# Patient Record
Sex: Female | Born: 1955 | Race: White | Hispanic: No | Marital: Married | Smoking: Never smoker
Health system: Southern US, Community
[De-identification: ages and names within clinical notes are randomized; demographics above are authoritative.]

## PROBLEM LIST (undated history)

## (undated) DIAGNOSIS — F32A Depression, unspecified: Secondary | ICD-10-CM

## (undated) DIAGNOSIS — Z780 Asymptomatic menopausal state: Secondary | ICD-10-CM

## (undated) DIAGNOSIS — F329 Major depressive disorder, single episode, unspecified: Secondary | ICD-10-CM

## (undated) HISTORY — DX: Major depressive disorder, single episode, unspecified: F32.9

## (undated) HISTORY — DX: Depression, unspecified: F32.A

## (undated) HISTORY — DX: Asymptomatic menopausal state: Z78.0

---

## 1999-05-07 ENCOUNTER — Ambulatory Visit (HOSPITAL_COMMUNITY): Admission: RE | Admit: 1999-05-07 | Discharge: 1999-05-07 | Payer: Self-pay | Admitting: Family Medicine

## 1999-05-07 ENCOUNTER — Encounter: Payer: Self-pay | Admitting: Family Medicine

## 1999-07-09 ENCOUNTER — Other Ambulatory Visit: Admission: RE | Admit: 1999-07-09 | Discharge: 1999-07-09 | Payer: Self-pay | Admitting: Family Medicine

## 2000-09-25 ENCOUNTER — Other Ambulatory Visit: Admission: RE | Admit: 2000-09-25 | Discharge: 2000-09-25 | Payer: Self-pay | Admitting: Family Medicine

## 2000-10-06 ENCOUNTER — Ambulatory Visit (HOSPITAL_COMMUNITY): Admission: RE | Admit: 2000-10-06 | Discharge: 2000-10-06 | Payer: Self-pay | Admitting: Family Medicine

## 2000-10-06 ENCOUNTER — Encounter: Payer: Self-pay | Admitting: Family Medicine

## 2001-08-16 ENCOUNTER — Ambulatory Visit (HOSPITAL_COMMUNITY): Admission: RE | Admit: 2001-08-16 | Discharge: 2001-08-16 | Payer: Self-pay | Admitting: Gastroenterology

## 2003-12-21 ENCOUNTER — Ambulatory Visit (HOSPITAL_COMMUNITY): Admission: RE | Admit: 2003-12-21 | Discharge: 2003-12-21 | Payer: Self-pay | Admitting: Family Medicine

## 2005-05-07 ENCOUNTER — Ambulatory Visit (HOSPITAL_COMMUNITY): Admission: RE | Admit: 2005-05-07 | Discharge: 2005-05-07 | Payer: Self-pay | Admitting: Family Medicine

## 2005-06-11 ENCOUNTER — Other Ambulatory Visit: Admission: RE | Admit: 2005-06-11 | Discharge: 2005-06-11 | Payer: Self-pay | Admitting: Family Medicine

## 2006-01-23 ENCOUNTER — Encounter: Admission: RE | Admit: 2006-01-23 | Discharge: 2006-01-23 | Payer: Self-pay | Admitting: Family Medicine

## 2006-06-26 ENCOUNTER — Other Ambulatory Visit: Admission: RE | Admit: 2006-06-26 | Discharge: 2006-06-26 | Payer: Self-pay | Admitting: Obstetrics and Gynecology

## 2007-07-01 ENCOUNTER — Ambulatory Visit (HOSPITAL_COMMUNITY): Admission: RE | Admit: 2007-07-01 | Discharge: 2007-07-01 | Payer: Self-pay | Admitting: Family Medicine

## 2007-08-27 ENCOUNTER — Other Ambulatory Visit: Admission: RE | Admit: 2007-08-27 | Discharge: 2007-08-27 | Payer: Self-pay | Admitting: Obstetrics and Gynecology

## 2008-08-02 ENCOUNTER — Ambulatory Visit (HOSPITAL_COMMUNITY): Admission: RE | Admit: 2008-08-02 | Discharge: 2008-08-02 | Payer: Self-pay | Admitting: Family Medicine

## 2008-12-18 HISTORY — PX: COLONOSCOPY: SHX174

## 2010-07-05 NOTE — Procedures (Signed)
[place]  Patient:    Andrea Estrada, Andrea Estrada Visit Number: [id number] MRN: [id number]          Service Type: END Location: ENDO Attending Physician:  Nelda Marseille Dictated by:   Petra Kuba, M.D. Proc. Date: 08/16/01 Admit Date:  08/16/2001   CC:         Gretta Arab. Valentina Lucks, M.D.   Procedure Report  PROCEDURE:  Colonoscopy.  INDICATIONS FOR PROCEDURE:  Family history of colon cancer with an aunt at a young age.  CONSENT:  Consent was signed.  MEDICATIONS USED: 1. Demerol 50 mg. 2. Versed 5 mg.  DESCRIPTION OF PROCEDURE:  Rectal inspection was pertinent for external hemorrhoids, small.  Digital examination was negative.  Pediatric video adjustable colonoscope was inserted, easily advanced around the colon to the cecum.  This did require some abdominal pressure, but no position changes. Cecum was identified by the appendiceal orifice and ileocecal valve.  The scope was inserted a short ways in the terminal ileum which was normal. Photodocumentation was obtained.  The scope was slowly withdrawn.  On slow withdrawal through the colon, the prep was adequate.  There was some liquid stool that required washing and suctioning, but no abnormalities were seen as we slowly withdrew back to the rectum.  Once back in the rectum, scope was retroflexed, pertinent for some tiny internal hemorrhoids.  Scope was straightened and readvanced a short ways up the left side of the colon, air was suctioned, scope removed.  The patient tolerated the procedure well. There was no obvious immediate complication.  ENDOSCOPIC DIAGNOSES: 1. Internal and external hemorrhoids, tiny. 2. Otherwise within normal limits to the terminal ileum.  PLAN:  Happy to see back p.r.n.  Repeat screening in 5 years.  Otherwise, return care to Dr. Valentina Lucks for the customary health care maintenance to include yearly rectals and guaiacs. Dictated by:   Petra Kuba,  M.D. Attending Physician:  Nelda Marseille DD:  08/16/01 TD:  08/17/01 Job: [id number]

## 2011-06-16 LAB — HM PAP SMEAR: HM Pap smear: NEGATIVE

## 2011-07-28 ENCOUNTER — Other Ambulatory Visit (HOSPITAL_COMMUNITY): Payer: Self-pay | Admitting: Family Medicine

## 2011-07-28 DIAGNOSIS — Z1231 Encounter for screening mammogram for malignant neoplasm of breast: Secondary | ICD-10-CM

## 2011-07-31 ENCOUNTER — Ambulatory Visit (HOSPITAL_COMMUNITY)
Admission: RE | Admit: 2011-07-31 | Discharge: 2011-07-31 | Disposition: A | Discharge status: Home / Self Care | Payer: BC Managed Care – PPO | Source: Ambulatory Visit | Attending: Family Medicine | Admitting: Family Medicine

## 2011-07-31 DIAGNOSIS — Z1231 Encounter for screening mammogram for malignant neoplasm of breast: Secondary | ICD-10-CM | POA: Insufficient documentation

## 2011-07-31 LAB — HM MAMMOGRAPHY: HM Mammogram: NEGATIVE

## 2012-06-15 ENCOUNTER — Encounter: Payer: Self-pay | Admitting: *Deleted

## 2012-06-17 ENCOUNTER — Ambulatory Visit: Payer: Self-pay | Admitting: Nurse Practitioner

## 2012-06-22 ENCOUNTER — Telehealth: Payer: Self-pay | Admitting: *Deleted

## 2012-06-22 NOTE — Telephone Encounter (Signed)
OK to refill Wellbutrin for 3 months and in the interim can come in for AEX.

## 2012-06-22 NOTE — Telephone Encounter (Signed)
Faxed request for Wellbutrin HCL XL 150 last seen 06/16/11 not scheduled for anything this year. Let me know how to proceed.

## 2012-06-23 ENCOUNTER — Other Ambulatory Visit: Payer: Self-pay | Admitting: *Deleted

## 2012-06-23 MED ORDER — BUPROPION HCL ER (XL) 150 MG PO TB24: 150.0000 mg | ORAL_TABLET | Freq: Every day | ORAL | Status: DC

## 2012-06-23 MED ORDER — BUPROPION HCL ER (SR) 150 MG PO TB12: 150.0000 mg | ORAL_TABLET | Freq: Every day | ORAL | Status: DC

## 2012-06-23 NOTE — Addendum Note (Signed)
Addended by: Luisa Dago on: 06/23/2012 04:24 PM   Modules accepted: Orders

## 2012-06-23 NOTE — Telephone Encounter (Signed)
Ok'd by [facility]

## 2012-06-23 NOTE — Telephone Encounter (Signed)
Pharmacy/pt requesting 90 day supply.  Pt previously approved 3 month supply.  RX re-sent as 90 day.  Also changed from SR to XL as XL is listed on fax refill request.

## 2013-02-23 ENCOUNTER — Other Ambulatory Visit: Payer: Self-pay | Admitting: Nurse Practitioner

## 2013-02-23 DIAGNOSIS — Z1231 Encounter for screening mammogram for malignant neoplasm of breast: Secondary | ICD-10-CM

## 2013-02-23 NOTE — Telephone Encounter (Signed)
Last Refilled: 06/16/11 #90/3 refills Last AEX: 06/16/11  Patient wanted to schedule AEX for early morning if possible. Scheduled her for earliest available for 04/04/13 @ 8:45  Okay to refill?   (Chart In Your door)

## 2013-02-23 NOTE — Telephone Encounter (Signed)
Refill for 3 months only

## 2013-03-03 ENCOUNTER — Ambulatory Visit (HOSPITAL_COMMUNITY)
Admission: RE | Admit: 2013-03-03 | Discharge: 2013-03-03 | Disposition: A | Discharge status: Home / Self Care | Payer: BC Managed Care – PPO | Source: Ambulatory Visit | Attending: Nurse Practitioner | Admitting: Nurse Practitioner

## 2013-03-03 DIAGNOSIS — Z1231 Encounter for screening mammogram for malignant neoplasm of breast: Secondary | ICD-10-CM

## 2013-04-04 ENCOUNTER — Encounter: Payer: Self-pay | Admitting: Nurse Practitioner

## 2013-04-04 ENCOUNTER — Ambulatory Visit (INDEPENDENT_AMBULATORY_CARE_PROVIDER_SITE_OTHER): Payer: BC Managed Care – PPO | Admitting: Nurse Practitioner

## 2013-04-04 VITALS — BP 128/82 | HR 72 | Ht 62.0 in | Wt 157.0 lb

## 2013-04-04 DIAGNOSIS — Z0000 Encounter for general adult medical examination without abnormal findings: Secondary | ICD-10-CM

## 2013-04-04 DIAGNOSIS — Z01419 Encounter for gynecological examination (general) (routine) without abnormal findings: Secondary | ICD-10-CM

## 2013-04-04 DIAGNOSIS — Z780 Asymptomatic menopausal state: Secondary | ICD-10-CM

## 2013-04-04 LAB — POCT URINALYSIS DIPSTICK
Bilirubin, UA: NEGATIVE
Blood, UA: NEGATIVE
GLUCOSE UA: NEGATIVE
Ketones, UA: NEGATIVE
Leukocytes, UA: NEGATIVE
NITRITE UA: NEGATIVE
PROTEIN UA: NEGATIVE
UROBILINOGEN UA: NEGATIVE
pH, UA: 7

## 2013-04-04 LAB — HEMOGLOBIN, FINGERSTICK: HEMOGLOBIN, FINGERSTICK: 14.3 g/dL (ref 12.0–16.0)

## 2013-04-04 MED ORDER — BUPROPION HCL ER (XL) 150 MG PO TB24: ORAL_TABLET | ORAL | Status: DC

## 2013-04-04 NOTE — Patient Instructions (Signed)

## 2013-04-04 NOTE — Progress Notes (Signed)
Patient ID: Andrea Estrada, female   DOB: 1955/05/05, 58 y.o.   MRN: [id number] 58 y.o. [id number] Married Caucasian Fe here for annual exam.  Doing well on Wellbutrin 1-2 tabs daily.  Wants to continue.  Only a few days that she needs an extra dose of med's.  Patient's last menstrual period was 02/18/2003.          Sexually active: no  The current method of family planning is post menopausal status.    Exercising: no  The patient does not participate in regular exercise at present. Smoker:  no  Health Maintenance: Pap:  06/16/11, WNL, neg HR HPV MMG:  03/03/13, Bi-Rads 1: negative Colonoscopy:  12/2008, repeat 5 years, normal BMD:  06/2005, 1.2/0.3 TDaP:  2007 Labs:  HB:  14.3 Urine:  Negative, ph 7.0   reports that she has never smoked. She has never used smokeless tobacco. She reports that she drinks about 3.5 ounces of alcohol per week. She reports that she does not use illicit drugs.  Past Medical History  Diagnosis Date  . Menopause   . Depression     situational depression     Past Surgical History  Procedure Laterality Date  . Colonoscopy  12/2008  . Cesarean section      x1    Current Outpatient Prescriptions  Medication Sig Dispense Refill  . Aspirin-Acetaminophen-Caffeine (EXCEDRIN PO) Take by mouth as needed.      Marland Kitchen. buPROPion (WELLBUTRIN XL) 150 MG 24 hr tablet TAKE 1 TO 2 TABLETS BY MOUTH EVERY DAY  60 tablet  12  . Naproxen Sodium (ALEVE PO) Take by mouth as needed.       No current facility-administered medications for this visit.    Family History  Problem Relation Age of Onset  . Diabetes Mother   . Hypertension Mother   . Hypertension Father   . Cancer Maternal Aunt     ROS:  Pertinent items are noted in HPI.  Otherwise, a comprehensive ROS was negative.  Exam:   BP 128/82  Pulse 72  Ht 5\' 2"  (1.575 m)  Wt 157 lb (71.215 kg)  BMI 28.71 kg/m2  LMP 02/18/2003 Height: 5\' 2"  (157.5 cm)  Ht Readings from Last 3 Encounters:  04/04/13 5\' 2"  (1.575  m)    General appearance: alert, cooperative and appears stated age Head: Normocephalic, without obvious abnormality, atraumatic Neck: no adenopathy, supple, symmetrical, trachea midline and thyroid normal to inspection and palpation Lungs: clear to auscultation bilaterally Breasts: normal appearance, no masses or tenderness Heart: regular rate and rhythm Abdomen: soft, non-tender; no masses,  no organomegaly Extremities: extremities normal, atraumatic, no cyanosis or edema Skin: Skin color, texture, turgor normal. No rashes or lesions Lymph nodes: Cervical, supraclavicular, and axillary nodes normal. No abnormal inguinal nodes palpated Neurologic: Grossly normal   Pelvic: External genitalia:  no lesions              Urethra:  normal appearing urethra with no masses, tenderness or lesions              Bartholin's and Skene's: normal                 Vagina: normal appearing vagina with normal color and discharge, no lesions              Cervix: anteverted              Pap taken: no Bimanual Exam:  Uterus:  normal size, contour, position, consistency, mobility, non-tender  Adnexa: no mass, fullness, tenderness               Rectovaginal: Confirms               Anus:  normal sphincter tone, no lesions  A:  Well Woman with normal exam  postmenopausal  P:   Pap smear as per guidelines   Mammogram due 02/2014  Will get repeat BMD at that time  Refill Wellbutrin for a year  She had last colonoscopy at [facility] clinic wants to maybe do one in [place] - Dr. Kenna Gilbert name given and she is going to check insurance coverage.  She is due this year.  Counseled on breast self exam, mammography screening, adequate intake of calcium and vitamin D, diet and exercise return annually or prn  An After Visit Summary was printed and given to the patient.

## 2013-04-05 NOTE — Progress Notes (Signed)
Encounter reviewed by Dr. Eshan Trupiano Silva.  

## 2013-12-19 ENCOUNTER — Encounter: Payer: Self-pay | Admitting: Nurse Practitioner

## 2014-08-28 ENCOUNTER — Other Ambulatory Visit: Payer: Self-pay | Admitting: Nurse Practitioner

## 2014-08-28 DIAGNOSIS — Z1231 Encounter for screening mammogram for malignant neoplasm of breast: Secondary | ICD-10-CM

## 2014-09-20 ENCOUNTER — Ambulatory Visit (HOSPITAL_COMMUNITY)
Admission: RE | Admit: 2014-09-20 | Discharge: 2014-09-20 | Disposition: A | Discharge status: Home / Self Care | Payer: BLUE CROSS/BLUE SHIELD | Source: Ambulatory Visit | Attending: Nurse Practitioner | Admitting: Nurse Practitioner

## 2014-09-20 DIAGNOSIS — Z1231 Encounter for screening mammogram for malignant neoplasm of breast: Secondary | ICD-10-CM | POA: Diagnosis present

## 2014-11-02 ENCOUNTER — Other Ambulatory Visit: Payer: Self-pay | Admitting: Nurse Practitioner

## 2014-11-02 NOTE — Telephone Encounter (Signed)
Patient appointment with Patty for aex and to evaluation of medication will refill only 30 days, no more

## 2014-11-02 NOTE — Telephone Encounter (Signed)
Rx approved electronically.

## 2014-11-02 NOTE — Telephone Encounter (Signed)
Medication refill request: Wellbutrin  Last AEX:  04/14/13 PG Next AEX: 11/08/14 PG Last MMG (if hormonal medication request): 09/20/14 [id number]:Neg Refill authorized: 04/04/13 #[id number]/ 12R. Today please advise.   Routed to DL

## 2014-11-08 ENCOUNTER — Ambulatory Visit: Payer: Self-pay | Admitting: Nurse Practitioner

## 2015-08-07 DIAGNOSIS — E663 Overweight: Secondary | ICD-10-CM | POA: Diagnosis not present

## 2015-08-07 DIAGNOSIS — Z6829 Body mass index (BMI) 29.0-29.9, adult: Secondary | ICD-10-CM | POA: Diagnosis not present

## 2015-09-06 DIAGNOSIS — Z6827 Body mass index (BMI) 27.0-27.9, adult: Secondary | ICD-10-CM | POA: Diagnosis not present

## 2015-09-06 DIAGNOSIS — E663 Overweight: Secondary | ICD-10-CM | POA: Diagnosis not present

## 2015-09-06 DIAGNOSIS — R41840 Attention and concentration deficit: Secondary | ICD-10-CM | POA: Diagnosis not present

## 2015-10-11 ENCOUNTER — Other Ambulatory Visit: Payer: Self-pay | Admitting: Nurse Practitioner

## 2015-10-11 DIAGNOSIS — Z1231 Encounter for screening mammogram for malignant neoplasm of breast: Secondary | ICD-10-CM

## 2015-10-17 ENCOUNTER — Ambulatory Visit: Payer: BLUE CROSS/BLUE SHIELD

## 2016-01-03 DIAGNOSIS — Z0000 Encounter for general adult medical examination without abnormal findings: Secondary | ICD-10-CM | POA: Diagnosis not present

## 2016-01-03 DIAGNOSIS — R030 Elevated blood-pressure reading, without diagnosis of hypertension: Secondary | ICD-10-CM | POA: Diagnosis not present

## 2016-01-03 DIAGNOSIS — F39 Unspecified mood [affective] disorder: Secondary | ICD-10-CM | POA: Diagnosis not present

## 2016-07-10 DIAGNOSIS — M545 Low back pain: Secondary | ICD-10-CM | POA: Diagnosis not present

## 2016-08-14 DIAGNOSIS — M238X2 Other internal derangements of left knee: Secondary | ICD-10-CM | POA: Diagnosis not present

## 2016-08-21 DIAGNOSIS — M25562 Pain in left knee: Secondary | ICD-10-CM | POA: Diagnosis not present

## 2016-08-26 ENCOUNTER — Ambulatory Visit
Admission: RE | Admit: 2016-08-26 | Discharge: 2016-08-26 | Disposition: A | Discharge status: Home / Self Care | Payer: BLUE CROSS/BLUE SHIELD | Source: Ambulatory Visit | Attending: Nurse Practitioner | Admitting: Nurse Practitioner

## 2016-08-26 DIAGNOSIS — Z1231 Encounter for screening mammogram for malignant neoplasm of breast: Secondary | ICD-10-CM | POA: Diagnosis not present

## 2016-08-27 DIAGNOSIS — M84362A Stress fracture, left tibia, initial encounter for fracture: Secondary | ICD-10-CM | POA: Diagnosis not present

## 2016-08-29 DIAGNOSIS — M8589 Other specified disorders of bone density and structure, multiple sites: Secondary | ICD-10-CM | POA: Diagnosis not present

## 2016-08-29 DIAGNOSIS — Z780 Asymptomatic menopausal state: Secondary | ICD-10-CM | POA: Diagnosis not present

## 2016-11-03 DIAGNOSIS — H35363 Drusen (degenerative) of macula, bilateral: Secondary | ICD-10-CM | POA: Diagnosis not present

## 2016-11-03 DIAGNOSIS — Z961 Presence of intraocular lens: Secondary | ICD-10-CM | POA: Diagnosis not present

## 2016-11-03 DIAGNOSIS — H5203 Hypermetropia, bilateral: Secondary | ICD-10-CM | POA: Diagnosis not present

## 2017-05-22 DIAGNOSIS — M25512 Pain in left shoulder: Secondary | ICD-10-CM | POA: Diagnosis not present

## 2017-07-03 DIAGNOSIS — F430 Acute stress reaction: Secondary | ICD-10-CM | POA: Diagnosis not present

## 2017-07-03 DIAGNOSIS — L309 Dermatitis, unspecified: Secondary | ICD-10-CM | POA: Diagnosis not present

## 2017-07-24 DIAGNOSIS — L509 Urticaria, unspecified: Secondary | ICD-10-CM | POA: Diagnosis not present

## 2017-07-24 DIAGNOSIS — F418 Other specified anxiety disorders: Secondary | ICD-10-CM | POA: Diagnosis not present

## 2017-08-11 DIAGNOSIS — F39 Unspecified mood [affective] disorder: Secondary | ICD-10-CM | POA: Diagnosis not present

## 2017-08-11 DIAGNOSIS — Z0000 Encounter for general adult medical examination without abnormal findings: Secondary | ICD-10-CM | POA: Diagnosis not present

## 2017-08-11 DIAGNOSIS — Z136 Encounter for screening for cardiovascular disorders: Secondary | ICD-10-CM | POA: Diagnosis not present

## 2017-11-03 ENCOUNTER — Other Ambulatory Visit: Payer: Self-pay | Admitting: Family Medicine

## 2017-11-03 DIAGNOSIS — Z1231 Encounter for screening mammogram for malignant neoplasm of breast: Secondary | ICD-10-CM

## 2017-11-10 ENCOUNTER — Ambulatory Visit: Payer: BLUE CROSS/BLUE SHIELD

## 2017-11-17 DIAGNOSIS — Z961 Presence of intraocular lens: Secondary | ICD-10-CM | POA: Diagnosis not present

## 2017-11-17 DIAGNOSIS — H524 Presbyopia: Secondary | ICD-10-CM | POA: Diagnosis not present

## 2017-11-30 ENCOUNTER — Ambulatory Visit: Payer: BLUE CROSS/BLUE SHIELD

## 2017-12-10 DIAGNOSIS — L821 Other seborrheic keratosis: Secondary | ICD-10-CM | POA: Diagnosis not present

## 2017-12-10 DIAGNOSIS — L570 Actinic keratosis: Secondary | ICD-10-CM | POA: Diagnosis not present

## 2017-12-10 DIAGNOSIS — B078 Other viral warts: Secondary | ICD-10-CM | POA: Diagnosis not present

## 2017-12-10 DIAGNOSIS — C44311 Basal cell carcinoma of skin of nose: Secondary | ICD-10-CM | POA: Diagnosis not present

## 2018-02-25 DIAGNOSIS — C44311 Basal cell carcinoma of skin of nose: Secondary | ICD-10-CM | POA: Diagnosis not present

## 2018-09-22 DIAGNOSIS — Z8249 Family history of ischemic heart disease and other diseases of the circulatory system: Secondary | ICD-10-CM | POA: Diagnosis not present

## 2018-09-22 DIAGNOSIS — Z0000 Encounter for general adult medical examination without abnormal findings: Secondary | ICD-10-CM | POA: Diagnosis not present

## 2018-10-22 DIAGNOSIS — S93491A Sprain of other ligament of right ankle, initial encounter: Secondary | ICD-10-CM | POA: Diagnosis not present

## 2018-11-23 ENCOUNTER — Other Ambulatory Visit: Payer: Self-pay | Admitting: Family Medicine

## 2018-11-23 DIAGNOSIS — Z1231 Encounter for screening mammogram for malignant neoplasm of breast: Secondary | ICD-10-CM

## 2018-11-26 ENCOUNTER — Ambulatory Visit: Payer: BLUE CROSS/BLUE SHIELD

## 2018-12-15 DIAGNOSIS — H5203 Hypermetropia, bilateral: Secondary | ICD-10-CM | POA: Diagnosis not present

## 2018-12-15 DIAGNOSIS — H353131 Nonexudative age-related macular degeneration, bilateral, early dry stage: Secondary | ICD-10-CM | POA: Diagnosis not present

## 2018-12-15 DIAGNOSIS — Z961 Presence of intraocular lens: Secondary | ICD-10-CM | POA: Diagnosis not present

## 2019-01-07 ENCOUNTER — Other Ambulatory Visit: Payer: Self-pay

## 2019-01-07 ENCOUNTER — Ambulatory Visit
Admission: RE | Admit: 2019-01-07 | Discharge: 2019-01-07 | Disposition: A | Discharge status: Home / Self Care | Payer: BC Managed Care – PPO | Source: Ambulatory Visit | Attending: Family Medicine | Admitting: Family Medicine

## 2019-01-07 DIAGNOSIS — Z1231 Encounter for screening mammogram for malignant neoplasm of breast: Secondary | ICD-10-CM

## 2019-09-30 ENCOUNTER — Other Ambulatory Visit: Payer: Self-pay | Admitting: Family Medicine

## 2019-09-30 DIAGNOSIS — M8580 Other specified disorders of bone density and structure, unspecified site: Secondary | ICD-10-CM

## 2020-02-16 ENCOUNTER — Other Ambulatory Visit: Payer: Self-pay | Admitting: Family Medicine

## 2020-02-16 DIAGNOSIS — Z1231 Encounter for screening mammogram for malignant neoplasm of breast: Secondary | ICD-10-CM

## 2020-04-03 ENCOUNTER — Ambulatory Visit: Payer: BC Managed Care – PPO

## 2020-05-04 ENCOUNTER — Other Ambulatory Visit: Payer: Self-pay

## 2020-05-04 ENCOUNTER — Ambulatory Visit
Admission: RE | Admit: 2020-05-04 | Discharge: 2020-05-04 | Disposition: A | Discharge status: Home / Self Care | Payer: Medicare Other | Source: Ambulatory Visit | Attending: Family Medicine | Admitting: Family Medicine

## 2020-05-04 DIAGNOSIS — Z1231 Encounter for screening mammogram for malignant neoplasm of breast: Secondary | ICD-10-CM

## 2020-05-07 ENCOUNTER — Other Ambulatory Visit: Payer: Self-pay | Admitting: Family Medicine

## 2020-05-07 DIAGNOSIS — R928 Other abnormal and inconclusive findings on diagnostic imaging of breast: Secondary | ICD-10-CM

## 2020-05-24 ENCOUNTER — Other Ambulatory Visit: Payer: Self-pay

## 2020-05-24 ENCOUNTER — Ambulatory Visit
Admission: RE | Admit: 2020-05-24 | Discharge: 2020-05-24 | Disposition: A | Discharge status: Home / Self Care | Payer: Medicare Other | Source: Ambulatory Visit | Attending: Family Medicine | Admitting: Family Medicine

## 2020-05-24 ENCOUNTER — Ambulatory Visit: Payer: Medicare Other

## 2020-05-24 DIAGNOSIS — R928 Other abnormal and inconclusive findings on diagnostic imaging of breast: Secondary | ICD-10-CM

## 2021-05-31 ENCOUNTER — Other Ambulatory Visit: Payer: Self-pay | Admitting: Family Medicine

## 2021-05-31 DIAGNOSIS — Z1231 Encounter for screening mammogram for malignant neoplasm of breast: Secondary | ICD-10-CM

## 2021-06-07 ENCOUNTER — Ambulatory Visit: Payer: Medicare Other

## 2021-08-07 ENCOUNTER — Ambulatory Visit
Admission: RE | Admit: 2021-08-07 | Discharge: 2021-08-07 | Disposition: A | Discharge status: Home / Self Care | Payer: Medicare Other | Source: Ambulatory Visit | Attending: Family Medicine | Admitting: Family Medicine

## 2021-08-07 DIAGNOSIS — Z1231 Encounter for screening mammogram for malignant neoplasm of breast: Secondary | ICD-10-CM

## 2022-07-22 ENCOUNTER — Other Ambulatory Visit: Payer: Self-pay | Admitting: Internal Medicine

## 2022-07-22 DIAGNOSIS — Z1231 Encounter for screening mammogram for malignant neoplasm of breast: Secondary | ICD-10-CM

## 2022-08-11 ENCOUNTER — Ambulatory Visit
Admission: RE | Admit: 2022-08-11 | Discharge: 2022-08-11 | Disposition: A | Discharge status: Home / Self Care | Payer: Medicare Other | Source: Ambulatory Visit | Attending: Internal Medicine | Admitting: Internal Medicine

## 2022-08-11 DIAGNOSIS — Z1231 Encounter for screening mammogram for malignant neoplasm of breast: Secondary | ICD-10-CM

## 2022-12-18 ENCOUNTER — Other Ambulatory Visit (HOSPITAL_COMMUNITY)
Admission: RE | Admit: 2022-12-18 | Discharge: 2022-12-18 | Disposition: A | Discharge status: Home / Self Care | Payer: Medicare Other | Source: Ambulatory Visit | Attending: Obstetrics and Gynecology | Admitting: Obstetrics and Gynecology

## 2022-12-18 ENCOUNTER — Other Ambulatory Visit: Payer: Self-pay | Admitting: Obstetrics and Gynecology

## 2022-12-18 DIAGNOSIS — Z01419 Encounter for gynecological examination (general) (routine) without abnormal findings: Secondary | ICD-10-CM | POA: Insufficient documentation

## 2022-12-18 DIAGNOSIS — Z1151 Encounter for screening for human papillomavirus (HPV): Secondary | ICD-10-CM | POA: Diagnosis not present

## 2022-12-23 LAB — CYTOLOGY - PAP
Comment: NEGATIVE
Diagnosis: NEGATIVE
High risk HPV: NEGATIVE

## 2023-09-16 ENCOUNTER — Other Ambulatory Visit: Payer: Self-pay | Admitting: Internal Medicine

## 2023-09-16 DIAGNOSIS — Z1231 Encounter for screening mammogram for malignant neoplasm of breast: Secondary | ICD-10-CM

## 2023-10-13 ENCOUNTER — Ambulatory Visit: Payer: PRIVATE HEALTH INSURANCE

## 2023-12-09 ENCOUNTER — Inpatient Hospital Stay: Admission: RE | Admit: 2023-12-09 | Discharge: 2023-12-09 | Attending: Internal Medicine

## 2023-12-09 DIAGNOSIS — Z1231 Encounter for screening mammogram for malignant neoplasm of breast: Secondary | ICD-10-CM

## 2024-06-15 IMAGING — MG MM DIGITAL SCREENING BILAT W/ TOMO AND CAD
8 series · 8 of 24 positions shown · non-contrast
Comparison: Previous exam(s).

CLINICAL DATA: Screening.

EXAM:
DIGITAL SCREENING BILATERAL MAMMOGRAM WITH TOMOSYNTHESIS AND CAD
TECHNIQUE: Bilateral screening digital craniocaudal and mediolateral oblique
mammograms were obtained. Bilateral screening digital breast
tomosynthesis was performed. The images were evaluated with
computer-aided detection.

[R CC synth-2D]
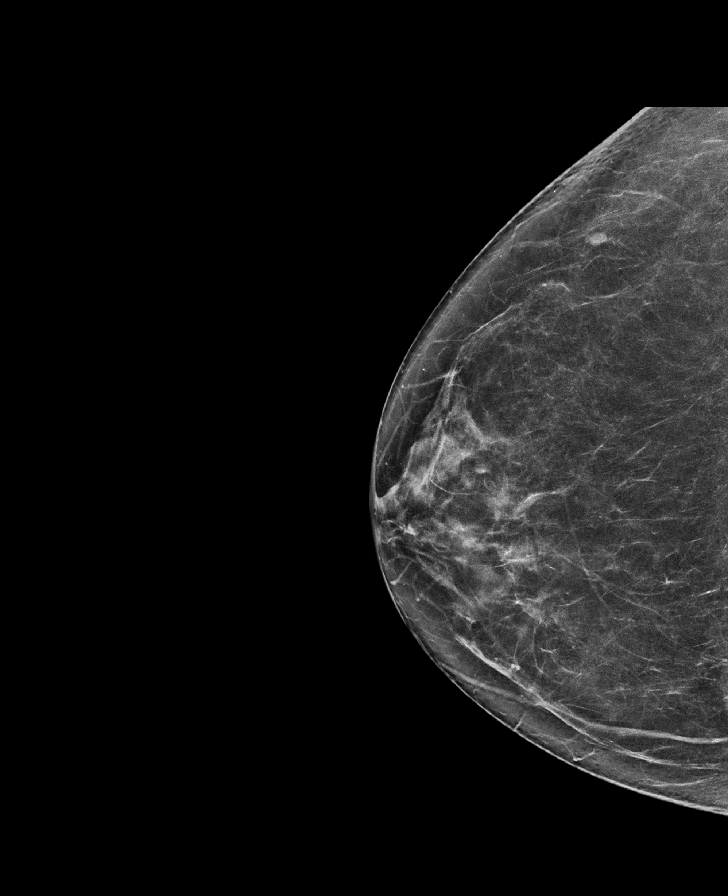

[R MLO synth-2D]
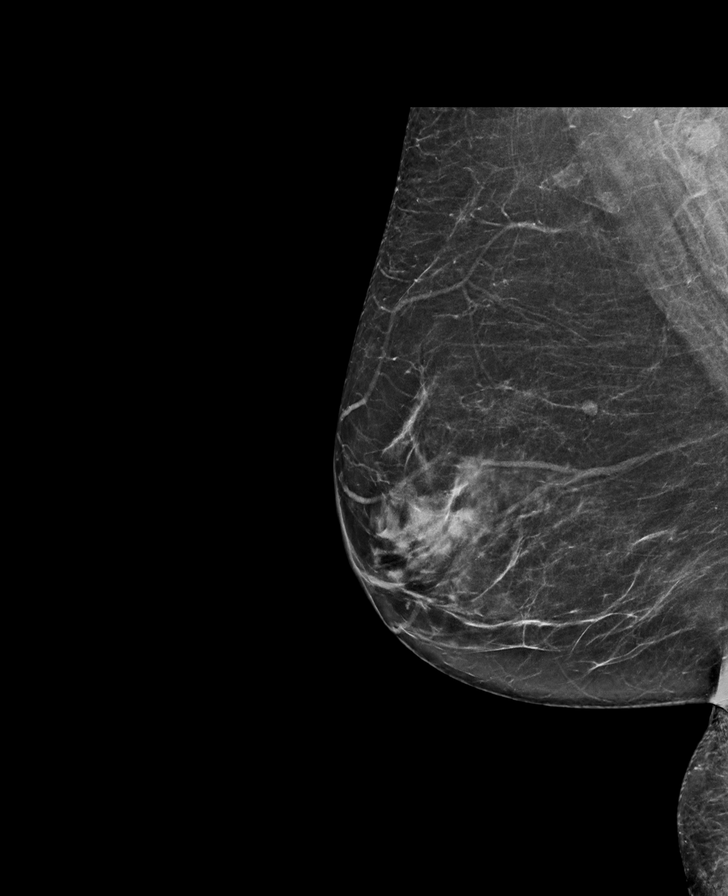

[L MLO synth-2D]
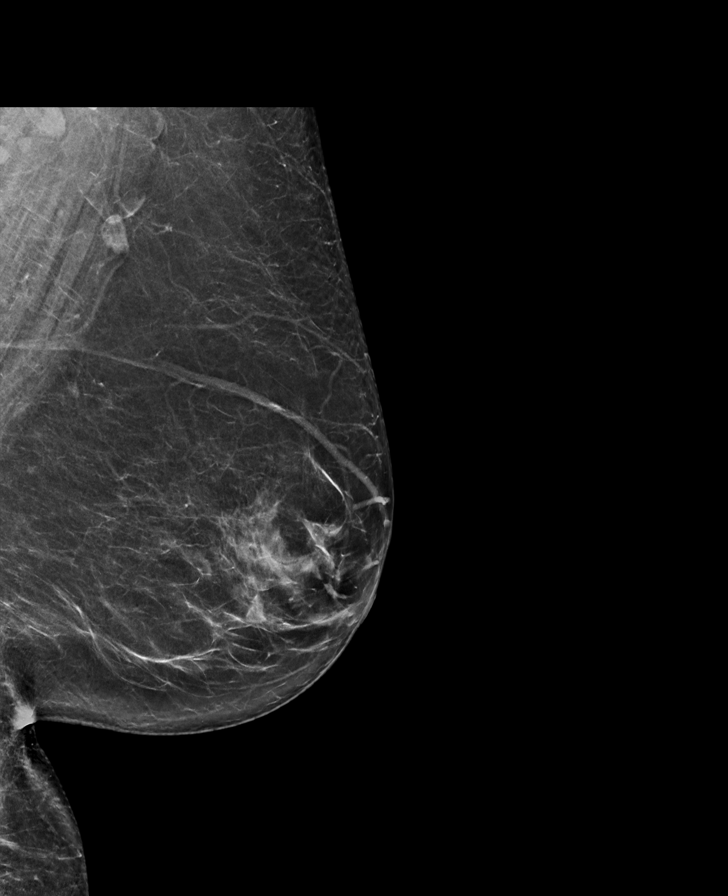

[L CC synth-2D]
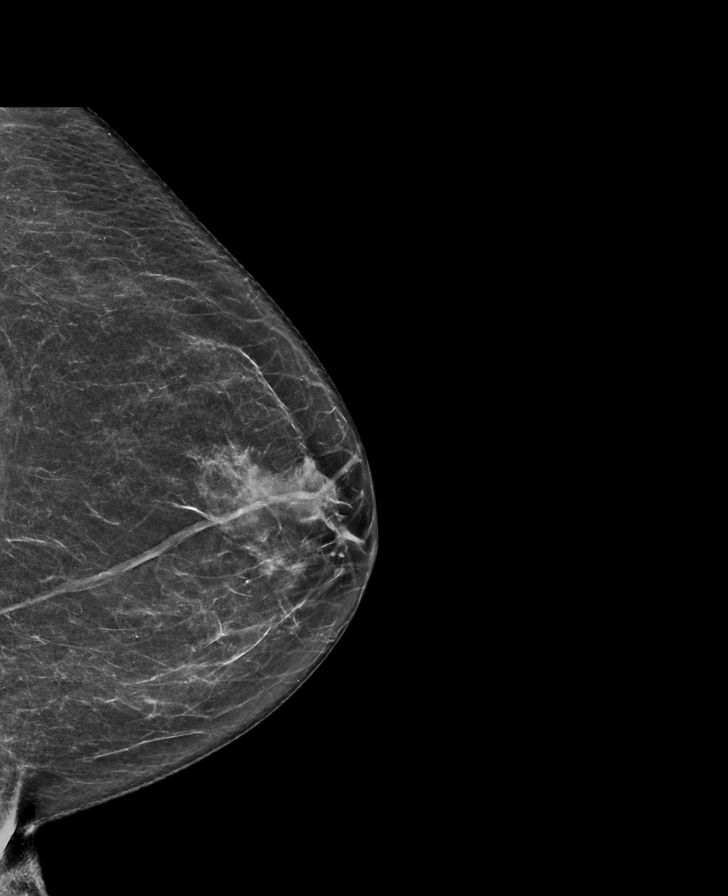

[R CC tomo · tomo slice 37/74.0]
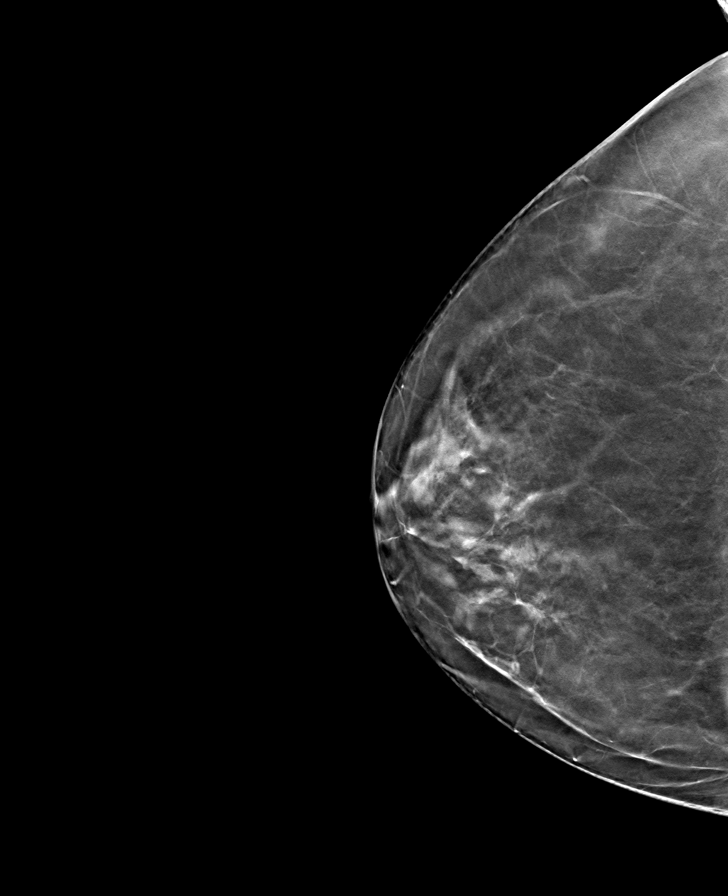

[R MLO tomo · tomo slice 37/74.0]
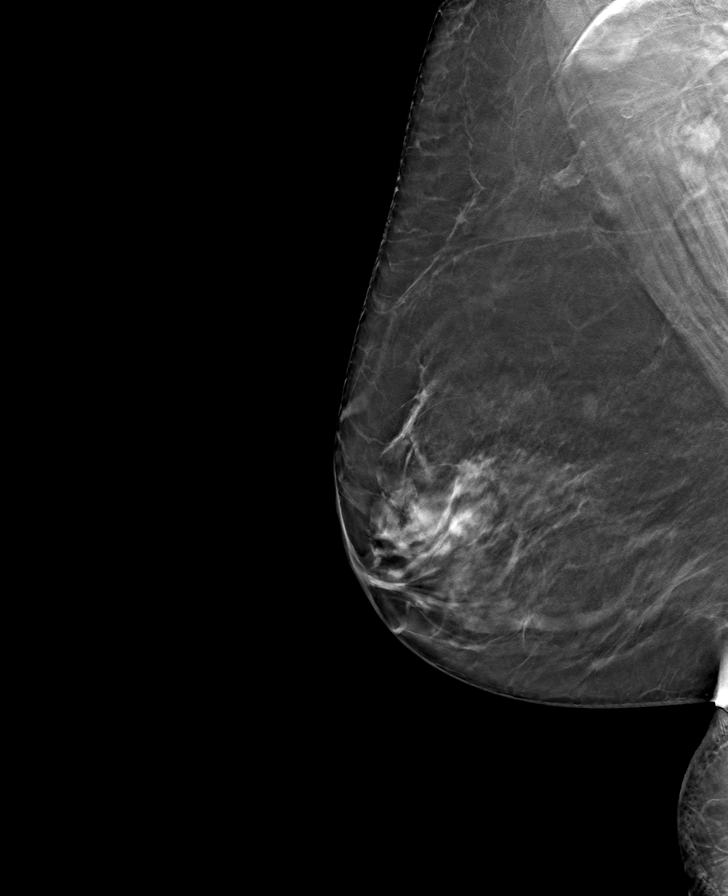

[L CC tomo · tomo slice 35/69.0]
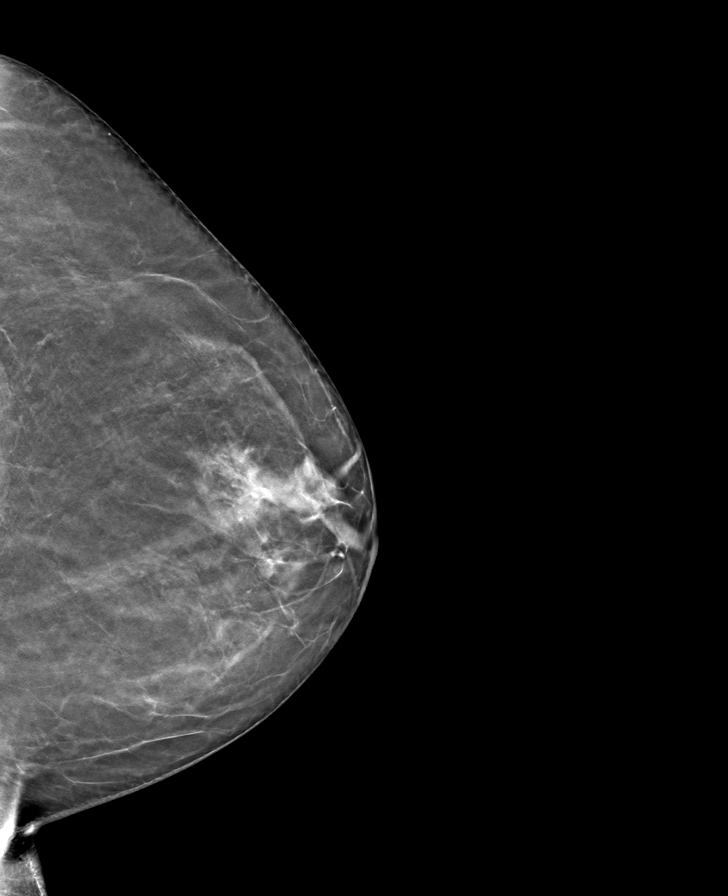

[L MLO tomo · tomo slice 39/77.0]
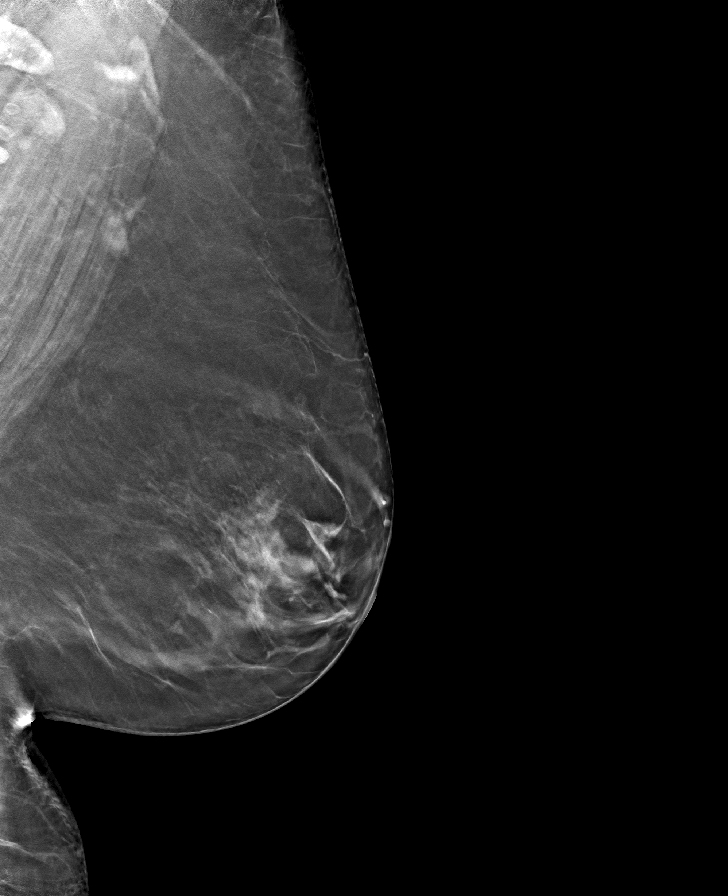

[8 of 24 positions shown; findings below may reference images not displayed]

ACR Breast Density Category b: There are scattered areas of
fibroglandular density.
FINDINGS: There are no findings suspicious for malignancy.
IMPRESSION: No mammographic evidence of malignancy. A result letter of this
screening mammogram will be mailed directly to the patient.

RECOMMENDATION:
Screening mammogram in one year. (Code:51-O-LD2)

BI-RADS CATEGORY  1: Negative.
